# Patient Record
Sex: Female | Born: 1990 | Race: White | Hispanic: No | Marital: Single | Smoking: Never smoker
Health system: Southern US, Community
[De-identification: ages and names within clinical notes are randomized; demographics above are authoritative.]

## PROBLEM LIST (undated history)

## (undated) DIAGNOSIS — T7840XA Allergy, unspecified, initial encounter: Secondary | ICD-10-CM

## (undated) DIAGNOSIS — F32A Depression, unspecified: Secondary | ICD-10-CM

## (undated) DIAGNOSIS — F419 Anxiety disorder, unspecified: Secondary | ICD-10-CM

## (undated) DIAGNOSIS — E079 Disorder of thyroid, unspecified: Secondary | ICD-10-CM

## (undated) DIAGNOSIS — G43909 Migraine, unspecified, not intractable, without status migrainosus: Secondary | ICD-10-CM

## (undated) DIAGNOSIS — J45909 Unspecified asthma, uncomplicated: Secondary | ICD-10-CM

## (undated) HISTORY — DX: Anxiety disorder, unspecified: F41.9

## (undated) HISTORY — DX: Depression, unspecified: F32.A

## (undated) HISTORY — DX: Disorder of thyroid, unspecified: E07.9

## (undated) HISTORY — DX: Migraine, unspecified, not intractable, without status migrainosus: G43.909

## (undated) HISTORY — PX: TALUS RELEASE: SHX2479

## (undated) HISTORY — DX: Unspecified asthma, uncomplicated: J45.909

## (undated) HISTORY — DX: Allergy, unspecified, initial encounter: T78.40XA

---

## 2010-11-10 DIAGNOSIS — Z789 Other specified health status: Secondary | ICD-10-CM | POA: Insufficient documentation

## 2021-03-25 DIAGNOSIS — M25571 Pain in right ankle and joints of right foot: Secondary | ICD-10-CM | POA: Diagnosis not present

## 2021-03-31 DIAGNOSIS — S92101A Unspecified fracture of right talus, initial encounter for closed fracture: Secondary | ICD-10-CM | POA: Diagnosis not present

## 2021-04-03 ENCOUNTER — Other Ambulatory Visit: Payer: Self-pay | Admitting: Orthopaedic Surgery

## 2021-04-03 DIAGNOSIS — S92154G Nondisplaced avulsion fracture (chip fracture) of right talus, subsequent encounter for fracture with delayed healing: Secondary | ICD-10-CM

## 2021-04-11 ENCOUNTER — Ambulatory Visit
Admission: RE | Admit: 2021-04-11 | Discharge: 2021-04-11 | Disposition: A | Discharge status: Home / Self Care | Payer: Self-pay | Source: Ambulatory Visit | Attending: Orthopaedic Surgery | Admitting: Orthopaedic Surgery

## 2021-04-11 DIAGNOSIS — S92101A Unspecified fracture of right talus, initial encounter for closed fracture: Secondary | ICD-10-CM | POA: Diagnosis not present

## 2021-04-11 DIAGNOSIS — S92154G Nondisplaced avulsion fracture (chip fracture) of right talus, subsequent encounter for fracture with delayed healing: Secondary | ICD-10-CM

## 2021-04-24 DIAGNOSIS — G8918 Other acute postprocedural pain: Secondary | ICD-10-CM | POA: Diagnosis not present

## 2021-04-24 DIAGNOSIS — S92121A Displaced fracture of body of right talus, initial encounter for closed fracture: Secondary | ICD-10-CM | POA: Diagnosis not present

## 2021-05-09 DIAGNOSIS — S92121D Displaced fracture of body of right talus, subsequent encounter for fracture with routine healing: Secondary | ICD-10-CM | POA: Diagnosis not present

## 2021-05-09 DIAGNOSIS — Z9889 Other specified postprocedural states: Secondary | ICD-10-CM | POA: Diagnosis not present

## 2021-06-06 DIAGNOSIS — S92144D Nondisplaced dome fracture of right talus, subsequent encounter for fracture with routine healing: Secondary | ICD-10-CM | POA: Diagnosis not present

## 2021-06-06 DIAGNOSIS — Z9889 Other specified postprocedural states: Secondary | ICD-10-CM | POA: Diagnosis not present

## 2021-06-09 DIAGNOSIS — S92144D Nondisplaced dome fracture of right talus, subsequent encounter for fracture with routine healing: Secondary | ICD-10-CM | POA: Diagnosis not present

## 2021-06-11 DIAGNOSIS — S92144D Nondisplaced dome fracture of right talus, subsequent encounter for fracture with routine healing: Secondary | ICD-10-CM | POA: Diagnosis not present

## 2021-06-16 DIAGNOSIS — S92144D Nondisplaced dome fracture of right talus, subsequent encounter for fracture with routine healing: Secondary | ICD-10-CM | POA: Diagnosis not present

## 2021-06-18 DIAGNOSIS — S92144D Nondisplaced dome fracture of right talus, subsequent encounter for fracture with routine healing: Secondary | ICD-10-CM | POA: Diagnosis not present

## 2021-06-23 DIAGNOSIS — S92144D Nondisplaced dome fracture of right talus, subsequent encounter for fracture with routine healing: Secondary | ICD-10-CM | POA: Diagnosis not present

## 2021-06-27 DIAGNOSIS — S92144D Nondisplaced dome fracture of right talus, subsequent encounter for fracture with routine healing: Secondary | ICD-10-CM | POA: Diagnosis not present

## 2021-07-01 DIAGNOSIS — S92144D Nondisplaced dome fracture of right talus, subsequent encounter for fracture with routine healing: Secondary | ICD-10-CM | POA: Diagnosis not present

## 2021-07-08 DIAGNOSIS — S92144D Nondisplaced dome fracture of right talus, subsequent encounter for fracture with routine healing: Secondary | ICD-10-CM | POA: Diagnosis not present

## 2021-07-10 DIAGNOSIS — S92144D Nondisplaced dome fracture of right talus, subsequent encounter for fracture with routine healing: Secondary | ICD-10-CM | POA: Diagnosis not present

## 2021-07-15 DIAGNOSIS — S92144D Nondisplaced dome fracture of right talus, subsequent encounter for fracture with routine healing: Secondary | ICD-10-CM | POA: Diagnosis not present

## 2021-07-17 DIAGNOSIS — S92144D Nondisplaced dome fracture of right talus, subsequent encounter for fracture with routine healing: Secondary | ICD-10-CM | POA: Diagnosis not present

## 2021-07-18 DIAGNOSIS — S92144D Nondisplaced dome fracture of right talus, subsequent encounter for fracture with routine healing: Secondary | ICD-10-CM | POA: Diagnosis not present

## 2021-07-24 DIAGNOSIS — S92144D Nondisplaced dome fracture of right talus, subsequent encounter for fracture with routine healing: Secondary | ICD-10-CM | POA: Diagnosis not present

## 2021-07-28 DIAGNOSIS — S92144D Nondisplaced dome fracture of right talus, subsequent encounter for fracture with routine healing: Secondary | ICD-10-CM | POA: Diagnosis not present

## 2021-07-30 DIAGNOSIS — S92144D Nondisplaced dome fracture of right talus, subsequent encounter for fracture with routine healing: Secondary | ICD-10-CM | POA: Diagnosis not present

## 2021-08-14 DIAGNOSIS — S92144D Nondisplaced dome fracture of right talus, subsequent encounter for fracture with routine healing: Secondary | ICD-10-CM | POA: Diagnosis not present

## 2021-08-18 DIAGNOSIS — S92144D Nondisplaced dome fracture of right talus, subsequent encounter for fracture with routine healing: Secondary | ICD-10-CM | POA: Diagnosis not present

## 2021-08-20 DIAGNOSIS — S92144D Nondisplaced dome fracture of right talus, subsequent encounter for fracture with routine healing: Secondary | ICD-10-CM | POA: Diagnosis not present

## 2021-08-29 DIAGNOSIS — S92144D Nondisplaced dome fracture of right talus, subsequent encounter for fracture with routine healing: Secondary | ICD-10-CM | POA: Diagnosis not present

## 2021-08-29 DIAGNOSIS — M25571 Pain in right ankle and joints of right foot: Secondary | ICD-10-CM | POA: Diagnosis not present

## 2021-09-04 DIAGNOSIS — S92144D Nondisplaced dome fracture of right talus, subsequent encounter for fracture with routine healing: Secondary | ICD-10-CM | POA: Diagnosis not present

## 2021-09-08 DIAGNOSIS — S92144D Nondisplaced dome fracture of right talus, subsequent encounter for fracture with routine healing: Secondary | ICD-10-CM | POA: Diagnosis not present

## 2021-09-10 DIAGNOSIS — S92144D Nondisplaced dome fracture of right talus, subsequent encounter for fracture with routine healing: Secondary | ICD-10-CM | POA: Diagnosis not present

## 2021-09-17 DIAGNOSIS — S92144D Nondisplaced dome fracture of right talus, subsequent encounter for fracture with routine healing: Secondary | ICD-10-CM | POA: Diagnosis not present

## 2021-09-19 DIAGNOSIS — S92144D Nondisplaced dome fracture of right talus, subsequent encounter for fracture with routine healing: Secondary | ICD-10-CM | POA: Diagnosis not present

## 2021-09-24 DIAGNOSIS — S92144D Nondisplaced dome fracture of right talus, subsequent encounter for fracture with routine healing: Secondary | ICD-10-CM | POA: Diagnosis not present

## 2021-10-07 DIAGNOSIS — S92144D Nondisplaced dome fracture of right talus, subsequent encounter for fracture with routine healing: Secondary | ICD-10-CM | POA: Diagnosis not present

## 2021-10-10 DIAGNOSIS — S92144D Nondisplaced dome fracture of right talus, subsequent encounter for fracture with routine healing: Secondary | ICD-10-CM | POA: Diagnosis not present

## 2021-10-23 NOTE — Progress Notes (Signed)
?Phone [phone] ?  ?Subjective:  ? ?Patient is a 31 y.o. female presenting for annual physical.   ? ?Chief Complaint  ?Patient presents with  ? Annual Exam  ?  Fasting W/ Labs  ? Gynecologic Exam  ?  Last Pap smear was 2019.   ? ? ?See problem oriented charting- ?ROS- full  review of systems was completed and negative ? ?The following were reviewed and entered/updated in epic: ?Past Medical History:  ?Diagnosis Date  ? Allergy   ? Anxiety   ? Asthma   ? Depression   ? Migraines   ? Thyroid disease   ? ?Patient Active Problem List  ? Diagnosis Date Noted  ? Migraine, unspecified, not intractable, without status migrainosus 10/24/2021  ? Psoriasis 10/24/2021  ? Hypothyroidism 10/24/2021  ? History of foreign travel 11/10/2010  ? ?Past Surgical History:  ?Procedure Laterality Date  ? TALUS RELEASE    ? ? ?History reviewed. No pertinent family history. ? ?Medications- reviewed and updated ?Current Outpatient Medications  ?Medication Sig Dispense Refill  ? Acetaminophen (TYLENOL PO) Take 800 mg by mouth as needed.    ? ibuprofen (ADVIL) 800 MG tablet Take 800 mg by mouth as needed for moderate pain.    ? Multiple Vitamins-Minerals (MULTIVITAMIN WITH MINERALS) tablet Take 1 tablet by mouth daily.    ? ondansetron (ZOFRAN-ODT) 4 MG disintegrating tablet Take 4 mg by mouth as needed.    ? ?No current facility-administered medications for this visit.  ? ?Allergies-reviewed and updated ?Allergies  ?Allergen Reactions  ? Penicillins Anaphylaxis and Hives  ? ? ?Social History  ? ?Social History Narrative  ? Not on file  ? ?Objective  ?Objective:  ?BP 107/76 (BP Location: Left Arm, Patient Position: Sitting, Cuff Size: Large)   Pulse 80   Temp 98 ?F (36.7 ?C) (Temporal)   Ht _0  (1.676 m)   Wt 198 lb 8 oz (90 kg)   LMP 10/11/2021 (Approximate)   SpO2 98%   BMI 32.04 kg/m?  ?Physical Exam ?Vitals and nursing note reviewed.  ?Constitutional:   ?   Appearance: Normal appearance.  ?HENT:  ?   Head: Normocephalic.  ?    Right Ear: Tympanic membrane normal.  ?   Left Ear: Tympanic membrane normal.  ?   Nose: Nose normal.  ?   Mouth/Throat:  ?   Mouth: Mucous membranes are moist.  ?Eyes:  ?   Pupils: Pupils are equal, round, and reactive to light.  ?Cardiovascular:  ?   Rate and Rhythm: Normal rate and regular rhythm.  ?Pulmonary:  ?   Effort: Pulmonary effort is normal.  ?   Breath sounds: Normal breath sounds.  ?Musculoskeletal:     ?   General: Normal range of motion.  ?   Cervical back: Normal range of motion.  ?Lymphadenopathy:  ?   Cervical: No cervical adenopathy.  ?Skin: ?   General: Skin is warm and dry.  ?Neurological:  ?   Mental Status: She is alert.  ?Psychiatric:     ?   Mood and Affect: Mood normal.     ?   Behavior: Behavior normal.  ? ? ?  ?Assessment and Plan  ? ?Health Maintenance counseling: ?1. Anticipatory guidance: Patient counseled regarding regular dental exams q6 months, eye exams,  avoiding smoking and second hand smoke, limiting alcohol to 1 beverage per day, no illicit drugs.   ?2. Risk factor reduction:  Advised patient of need for regular exercise and diet rich with  fruits and vegetables to reduce risk of heart attack and stroke. ?Wt Readings from Last 3 Encounters:  ?10/24/21 198 lb 8 oz (90 kg)  ? ?3. Immunizations/screenings/ancillary studies ?Immunization History  ?Administered Date(s) Administered  ? DTaP 08/14/1991, 10/19/1991, 12/21/1991, 12/18/1992, 06/22/1996  ? HPV Quadrivalent 08/04/2006, 08/08/2007, 10/11/2007  ? Hepatitis A 11/10/2010  ? Hepatitis B, ped/adol 12/11/1998, 12/03/1999, 03/24/2000  ? HiB (PRP-OMP) 08/24/1991, 10/19/1991, 12/21/1991, 09/13/1992  ? IPV 08/24/1991, 10/19/1991, 12/21/1991, 06/22/1996  ? Influenza, Seasonal, Injecte, Preservative Fre 05/11/2015  ? Influenza-Unspecified 05/28/2021  ? MMR 09/20/1992, 06/22/1996  ? Meningococcal Polysaccharide 08/04/2006  ? PFIZER(Purple Top)SARS-COV-2 Vaccination 10/30/2019  ? PPD Test 08/18/2011, 03/01/2013, 02/23/2019  ? Tdap  08/04/2006, 12/20/2017  ? Typhoid Live 11/10/2010  ? ?Health Maintenance Due  ?Topic Date Due  ? HIV Screening  Never done  ? Hepatitis C Screening  Never done  ?  ?4. Cervical cancer screening: today ?5. Skin cancer screening- advised regular sunscreen use. Denies worrisome, changing, or new skin lesions.  ?6. Birth control/STD check: N/A ?7. Smoking associated screening: non- smoker ?8. Alcohol screening: 2--3/week ? ?Problem List Items Addressed This Visit   ?None ?Visit Diagnoses   ? ? Annual physical exam    -  Primary  ? Relevant Orders  ? Comprehensive metabolic panel  ? TSH  ? Lipid panel  ? CBC with Differential/Platelet  ? Cytology - PAP( [place])  ? History of hypothyroidism      ? Relevant Orders  ? T4, free  ? ?  ? ? ?Recommended follow up: Return for any future concerns. ?No future appointments. ? ? ?Lab/Order associations: fasting ? ?Jeanie Sewer, NP ? ? ?

## 2021-10-24 ENCOUNTER — Ambulatory Visit: Payer: 59 | Admitting: Family

## 2021-10-24 ENCOUNTER — Other Ambulatory Visit (HOSPITAL_COMMUNITY)
Admission: RE | Admit: 2021-10-24 | Discharge: 2021-10-24 | Disposition: A | Discharge status: Home / Self Care | Payer: 59 | Source: Ambulatory Visit | Attending: Family | Admitting: Family

## 2021-10-24 ENCOUNTER — Encounter: Payer: Self-pay | Admitting: Family

## 2021-10-24 VITALS — BP 107/76 | HR 80 | Temp 98.0°F | Ht 66.0 in | Wt 198.5 lb

## 2021-10-24 DIAGNOSIS — G43909 Migraine, unspecified, not intractable, without status migrainosus: Secondary | ICD-10-CM | POA: Insufficient documentation

## 2021-10-24 DIAGNOSIS — Z8639 Personal history of other endocrine, nutritional and metabolic disease: Secondary | ICD-10-CM | POA: Diagnosis not present

## 2021-10-24 DIAGNOSIS — E039 Hypothyroidism, unspecified: Secondary | ICD-10-CM | POA: Insufficient documentation

## 2021-10-24 DIAGNOSIS — Z0000 Encounter for general adult medical examination without abnormal findings: Secondary | ICD-10-CM | POA: Insufficient documentation

## 2021-10-24 DIAGNOSIS — L409 Psoriasis, unspecified: Secondary | ICD-10-CM | POA: Insufficient documentation

## 2021-10-24 LAB — LIPID PANEL
Cholesterol: 153 mg/dL (ref 0–200)
HDL: 39.4 mg/dL (ref >39.00)
LDL Cholesterol: 84 mg/dL (ref 0–99)
NonHDL: 113.47
Total CHOL/HDL Ratio: 4
Triglycerides: 146 mg/dL (ref 0.0–149.0)
VLDL: 29.2 mg/dL (ref 0.0–40.0)

## 2021-10-24 LAB — CBC WITH DIFFERENTIAL/PLATELET
Basophils Absolute: 0 10*3/uL (ref 0.0–0.1)
Basophils Relative: 0.5 % (ref 0.0–3.0)
Eosinophils Absolute: 0.2 10*3/uL (ref 0.0–0.7)
Eosinophils Relative: 2.3 % (ref 0.0–5.0)
HCT: 40.8 % (ref 36.0–46.0)
Hemoglobin: 13.7 g/dL (ref 12.0–15.0)
Lymphocytes Relative: 32.3 % (ref 12.0–46.0)
Lymphs Abs: 2.2 10*3/uL (ref 0.7–4.0)
MCHC: 33.7 g/dL (ref 30.0–36.0)
MCV: 89.7 fl (ref 78.0–100.0)
Monocytes Absolute: 0.6 10*3/uL (ref 0.1–1.0)
Monocytes Relative: 9.6 % (ref 3.0–12.0)
Neutro Abs: 3.7 10*3/uL (ref 1.4–7.7)
Neutrophils Relative %: 55.3 % (ref 43.0–77.0)
Platelets: 206 10*3/uL (ref 150.0–400.0)
RBC: 4.54 Mil/uL (ref 3.87–5.11)
RDW: 12.8 % (ref 11.5–15.5)
WBC: 6.6 10*3/uL (ref 4.0–10.5)

## 2021-10-24 LAB — COMPREHENSIVE METABOLIC PANEL
ALT: 21 U/L (ref 0–35)
AST: 19 U/L (ref 0–37)
Albumin: 4.6 g/dL (ref 3.5–5.2)
Alkaline Phosphatase: 53 U/L (ref 39–117)
BUN: 15 mg/dL (ref 6–23)
CO2: 26 mEq/L (ref 19–32)
Calcium: 9.5 mg/dL (ref 8.4–10.5)
Chloride: 103 mEq/L (ref 96–112)
Creatinine, Ser: 0.85 mg/dL (ref 0.40–1.20)
GFR: 91.98 mL/min (ref >60.00)
Glucose, Bld: 91 mg/dL (ref 70–99)
Potassium: 4.4 mEq/L (ref 3.5–5.1)
Sodium: 138 mEq/L (ref 135–145)
Total Bilirubin: 0.5 mg/dL (ref 0.2–1.2)
Total Protein: 7.2 g/dL (ref 6.0–8.3)

## 2021-10-24 LAB — T4, FREE: Free T4: 0.77 ng/dL (ref 0.60–1.60)

## 2021-10-24 LAB — TSH: TSH: 1.99 u[IU]/mL (ref 0.35–5.50)

## 2021-10-24 NOTE — Patient Instructions (Signed)
Welcome to [facility] at [facility]! It was a pleasure meeting you today.    PLEASE NOTE:  If you had any LAB tests please let us know if you have not heard back within a few days. You may see your results on MyChart before we have a chance to review them but we will give you a call once they are reviewed by us. If we ordered any REFERRALS today, please let us know if you have not heard from their office within the next week.  Let us know through MyChart if you are needing REFILLS, or have your pharmacy send us the request. You can also use MyChart to communicate with me or any office staff.  Please try these tips to maintain a healthy lifestyle:  Eat most of your calories during the day when you are active. Eliminate processed foods including packaged sweets (pies, cakes, cookies), reduce intake of potatoes, white bread, white pasta, and white rice. Look for whole grain options, oat flour or almond flour.  Each meal should contain half fruits/vegetables, one quarter protein, and one quarter carbs (no bigger than a computer mouse).  Cut down on sweet beverages. This includes juice, soda, and sweet tea. Also watch fruit intake, though this is a healthier sweet option, it still contains natural sugar! Limit to 3 servings daily.  Drink at least 1 glass of water with each meal and aim for at least 8 glasses per day  Exercise at least 150 minutes every week.   

## 2021-11-03 LAB — CYTOLOGY - PAP
Adequacy: ABNORMAL
Chlamydia: NEGATIVE
Comment: NEGATIVE
Comment: NORMAL
Neisseria Gonorrhea: NEGATIVE

## 2021-11-05 NOTE — Progress Notes (Signed)
Debra Webb, ? ?All labs are within normal range, glucose (sugar), electrolytes, kidney and liver function, blood count, thyroid, and cholesterol numbers are all good.   ?Your pap specimen was negative for STDs, but unfortunately, not sufficient for cervical cell testing, so have to repeat testing. You can schedule this when convenient. ? ?Keep up the good work with controlling your diet and continue to try and shoot for 30 minutes of exercise daily!  ?

## 2022-05-04 ENCOUNTER — Encounter: Payer: Self-pay | Admitting: *Deleted

## 2022-06-05 ENCOUNTER — Other Ambulatory Visit (HOSPITAL_COMMUNITY): Payer: Self-pay

## 2022-06-05 MED ORDER — CIPROFLOXACIN HCL 500 MG PO TABS
500.0000 mg | ORAL_TABLET | Freq: Once | ORAL | 0 refills | Status: AC
  Filled 2022-06-05: qty 1, 1d supply, fill #0

## 2022-07-23 ENCOUNTER — Encounter: Payer: Self-pay | Admitting: *Deleted

## 2022-10-29 IMAGING — CT CT FOOT*R* W/O CM
4 series · 13 of 27 positions shown, 15 images · non-contrast
Comparison: None.

CLINICAL DATA: Right talar fracture.  Pain and swelling.

EXAM:
CT OF THE RIGHT FOOT WITHOUT CONTRAST
TECHNIQUE: Multidetector CT imaging of the right foot was performed according
to the standard protocol. Multiplanar CT image reconstructions were
also generated.

[Series 4: soft tissue lower extremity · axial · 0.55mm/px · z∈[-193,-89]mm · 4 of 88 slices shown]
[im 18/88  soft-tissue]
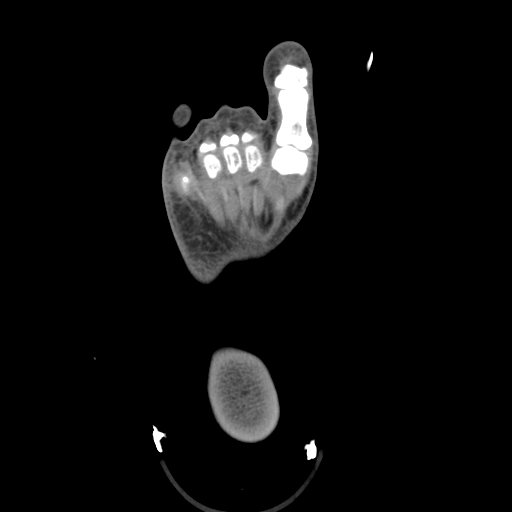
[im 35/88  soft-tissue]
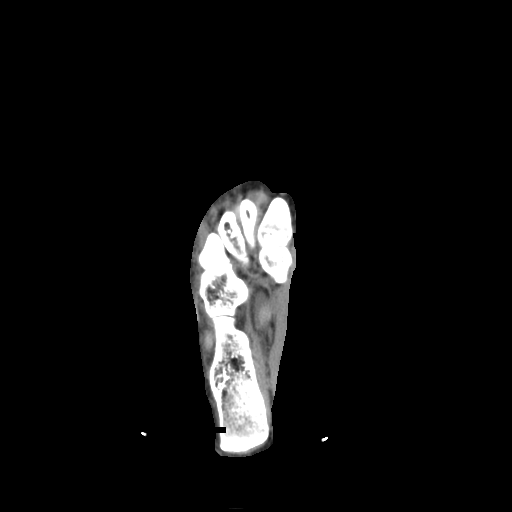
[im 53/88  soft-tissue]
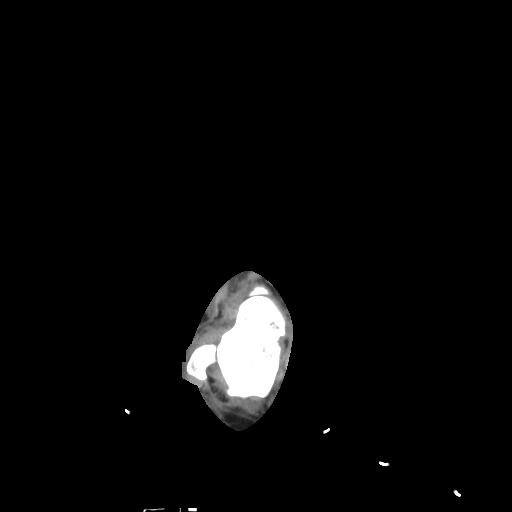
[im 70/88  soft-tissue]
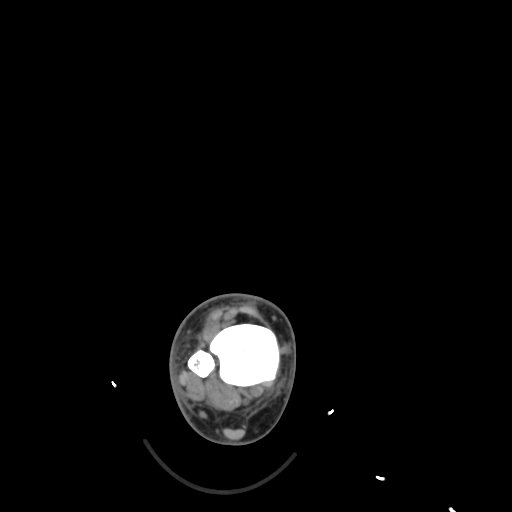

[Series 10: sagsoft tissue · sagittal · 0.34mm/px · 5 of 77 slices shown, 6 images]
[im 26/77  bone]
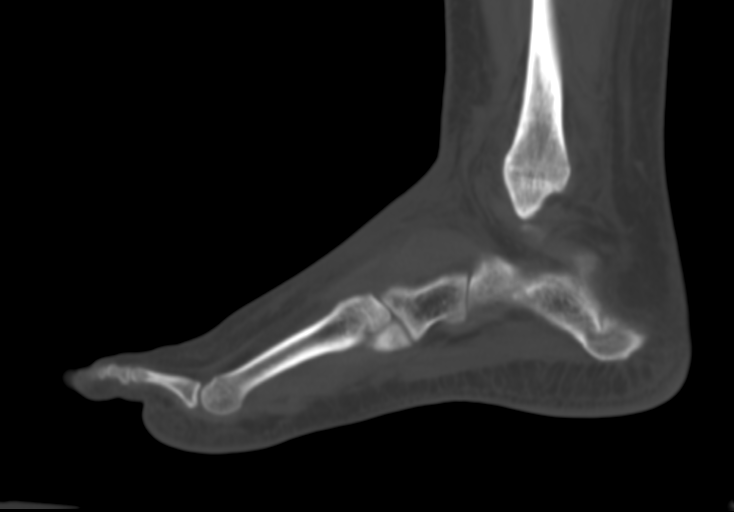
[im 32/77  bone]
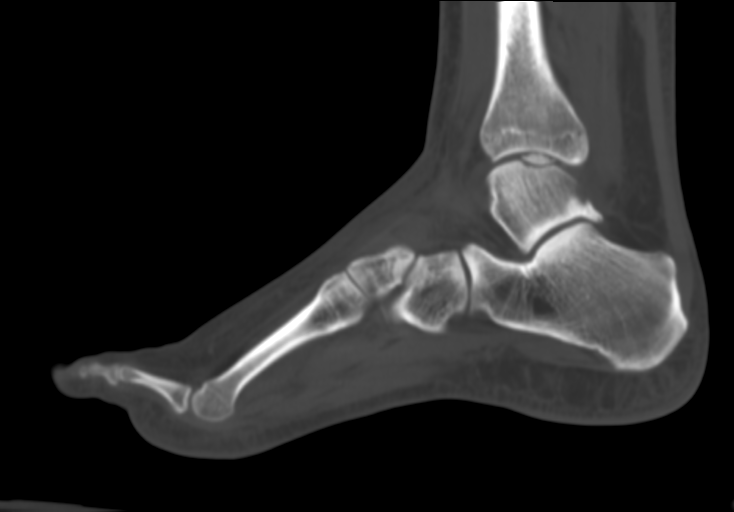
[im 39/77  soft-tissue]
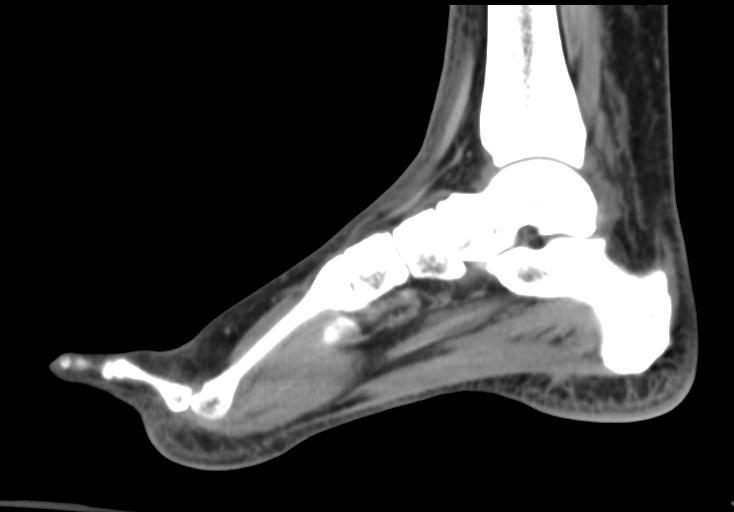
[im 39/77  bone]
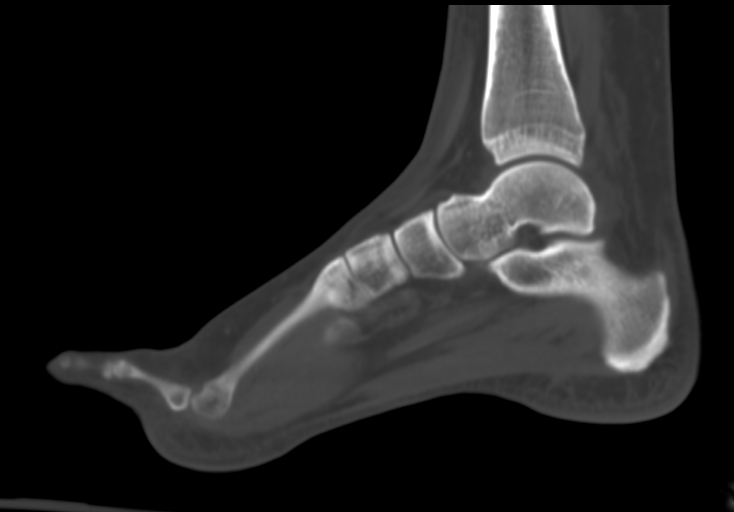
[im 45/77  bone]
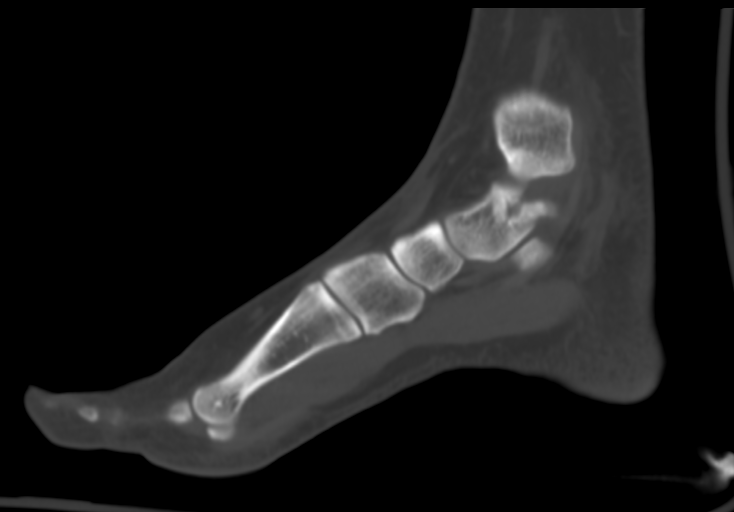
[im 51/77  bone]
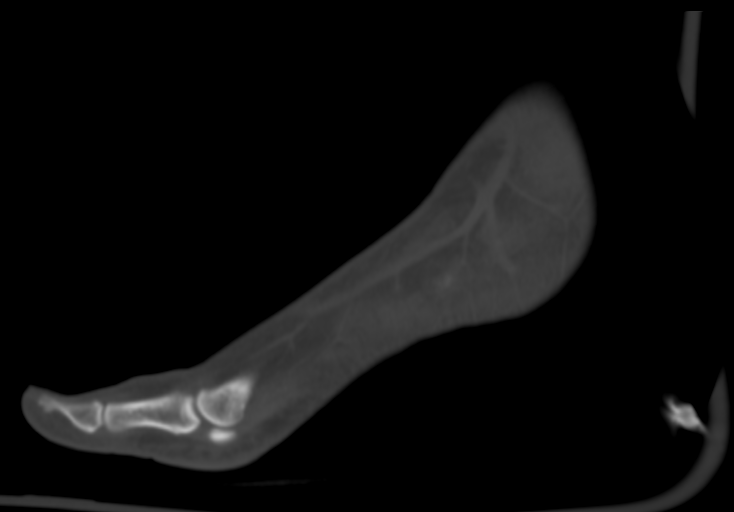

[Series 602: cor foot · axial · 0.55mm/px · z∈[-232,-192]mm · 2 of 64 slices shown, 3 images]
[im 22/64  soft-tissue]
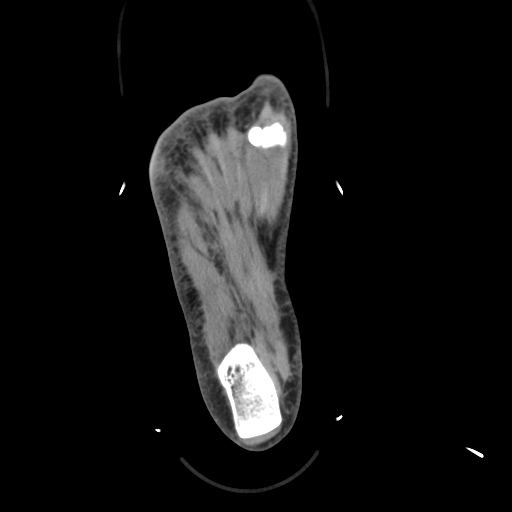
[im 22/64  bone]
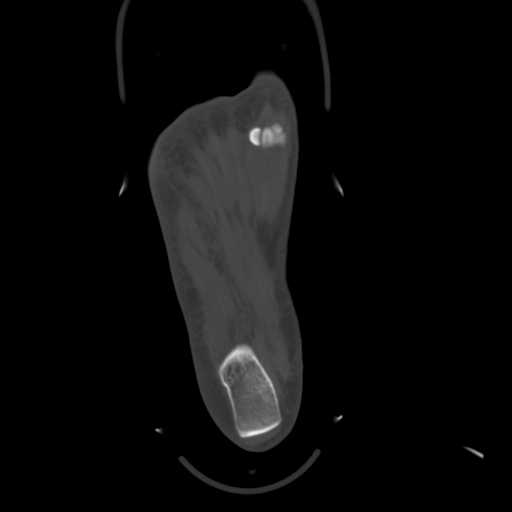
[im 43/64  bone]
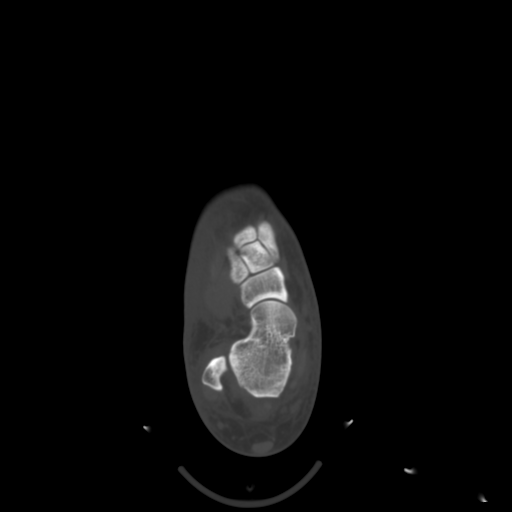

[Series 603: bone cor foot · axial · 0.55mm/px · z∈[-246,-212]mm · 2 of 56 slices shown]
[im 19/56  bone]
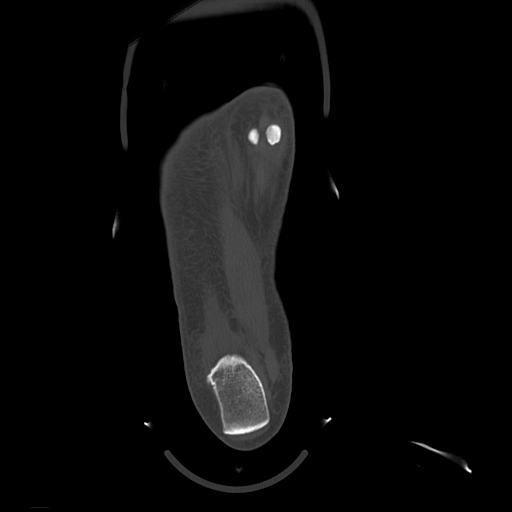
[im 37/56  bone]
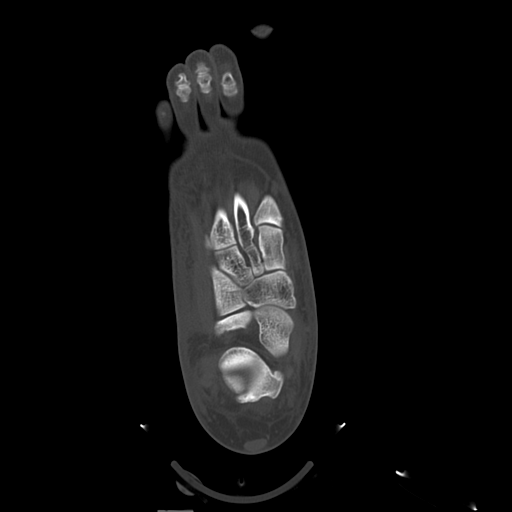

[13 of 27 positions shown; findings below may reference images not displayed]

FINDINGS: Bones/Joint/Cartilage

8 x 13 mm osteochondral lesion involving the mid lateral corner of
the talus with the osteochondral fragment flipped 180 degrees with
the articular surface abutting the donor site.

No other fracture or dislocation. Normal alignment. Moderate ankle
joint effusion.

Ligaments

Ligaments are suboptimally evaluated by CT.

Muscles and Tendons
Muscles are normal. Flexor, extensor, peroneal and Achilles tendons
are grossly intact.

Soft tissue
No fluid collection or hematoma. No soft tissue mass. Soft tissue
edema around the ankle and dorsal aspect of the foot.
IMPRESSION: 1. Unstable 8 x 13 mm osteochondral lesion involving the mid lateral
corner of the talus with the osteochondral fragment flipped 180
degrees with the articular surface abutting the donor site.

## 2023-02-17 ENCOUNTER — Ambulatory Visit (INDEPENDENT_AMBULATORY_CARE_PROVIDER_SITE_OTHER): Payer: 59 | Admitting: Family

## 2023-02-17 ENCOUNTER — Other Ambulatory Visit (HOSPITAL_COMMUNITY)
Admission: RE | Admit: 2023-02-17 | Discharge: 2023-02-17 | Disposition: A | Discharge status: Home / Self Care | Payer: 59 | Source: Ambulatory Visit | Attending: Family | Admitting: Family

## 2023-02-17 ENCOUNTER — Encounter: Payer: Self-pay | Admitting: Family

## 2023-02-17 VITALS — BP 110/75 | Temp 97.3°F | Ht 66.0 in | Wt 193.6 lb

## 2023-02-17 DIAGNOSIS — Z1159 Encounter for screening for other viral diseases: Secondary | ICD-10-CM | POA: Diagnosis not present

## 2023-02-17 DIAGNOSIS — Z0000 Encounter for general adult medical examination without abnormal findings: Secondary | ICD-10-CM

## 2023-02-17 DIAGNOSIS — Z114 Encounter for screening for human immunodeficiency virus [HIV]: Secondary | ICD-10-CM

## 2023-02-17 LAB — COMPREHENSIVE METABOLIC PANEL
ALT: 23 U/L (ref 0–35)
AST: 20 U/L (ref 0–37)
Albumin: 4.9 g/dL (ref 3.5–5.2)
Alkaline Phosphatase: 54 U/L (ref 39–117)
BUN: 14 mg/dL (ref 6–23)
CO2: 26 mEq/L (ref 19–32)
Calcium: 10.3 mg/dL (ref 8.4–10.5)
Chloride: 103 mEq/L (ref 96–112)
Creatinine, Ser: 0.88 mg/dL (ref 0.40–1.20)
GFR: 87.41 mL/min (ref >60.00)
Glucose, Bld: 95 mg/dL (ref 70–99)
Potassium: 4.8 mEq/L (ref 3.5–5.1)
Sodium: 139 mEq/L (ref 135–145)
Total Bilirubin: 0.6 mg/dL (ref 0.2–1.2)
Total Protein: 7.7 g/dL (ref 6.0–8.3)

## 2023-02-17 LAB — CBC WITH DIFFERENTIAL/PLATELET
Basophils Absolute: 0 10*3/uL (ref 0.0–0.1)
Basophils Relative: 0.6 % (ref 0.0–3.0)
Eosinophils Absolute: 0.2 10*3/uL (ref 0.0–0.7)
Eosinophils Relative: 2.6 % (ref 0.0–5.0)
HCT: 43.9 % (ref 36.0–46.0)
Hemoglobin: 14.7 g/dL (ref 12.0–15.0)
Lymphocytes Relative: 29.7 % (ref 12.0–46.0)
Lymphs Abs: 2.3 10*3/uL (ref 0.7–4.0)
MCHC: 33.5 g/dL (ref 30.0–36.0)
MCV: 90.4 fl (ref 78.0–100.0)
Monocytes Absolute: 0.6 10*3/uL (ref 0.1–1.0)
Monocytes Relative: 7.5 % (ref 3.0–12.0)
Neutro Abs: 4.7 10*3/uL (ref 1.4–7.7)
Neutrophils Relative %: 59.6 % (ref 43.0–77.0)
Platelets: 229 10*3/uL (ref 150.0–400.0)
RBC: 4.86 Mil/uL (ref 3.87–5.11)
RDW: 13.1 % (ref 11.5–15.5)
WBC: 7.8 10*3/uL (ref 4.0–10.5)

## 2023-02-17 NOTE — Patient Instructions (Signed)
It was very nice to see you today!    I will review your lab results via MyChart in a few days.   The PAP results will take a little longer.   Have a great rest of the summer and upcoming wedding!      PLEASE NOTE:  If you had any lab tests please let us know if you have not heard back within a few days. You may see your results on MyChart before we have a chance to review them but we will give you a call once they are reviewed by [place]. If we ordered any referrals today, please let us know if you have not heard from their office within the next week.

## 2023-02-17 NOTE — Progress Notes (Addendum)
Phone [phone]   Subjective:   Patient is a 32 y.o. female presenting for annual physical.    Chief Complaint  Patient presents with   Gynecologic Exam   Annual Exam    Fasting w/labs    See problem oriented charting- ROS- full  review of systems was completed and negative  The following were reviewed and entered/updated in epic: Past Medical History:  Diagnosis Date   Allergy    Anxiety    Asthma    Depression    Migraines    Thyroid disease    Patient Active Problem List   Diagnosis Date Noted   Migraine, unspecified, not intractable, without status migrainosus 10/24/2021   Psoriasis 10/24/2021   Hypothyroidism 10/24/2021   Past Surgical History:  Procedure Laterality Date   TALUS RELEASE      No family history on file.  Medications- reviewed and updated Current Outpatient Medications  Medication Sig Dispense Refill   Acetaminophen (TYLENOL PO) Take 800 mg by mouth as needed.     ibuprofen (ADVIL) 800 MG tablet Take 800 mg by mouth as needed for moderate pain.     Multiple Vitamins-Minerals (MULTIVITAMIN WITH MINERALS) tablet Take 1 tablet by mouth daily.     ondansetron (ZOFRAN-ODT) 4 MG disintegrating tablet Take 4 mg by mouth as needed.     No current facility-administered medications for this visit.   Allergies-reviewed and updated Allergies  Allergen Reactions   Penicillins Anaphylaxis and Hives    Social History   Social History Narrative   Not on file   Objective  Objective:  BP 110/75   Temp (!) 97.3 F (36.3 C) (Temporal)   Ht 5\' 6"  (1.676 m)   Wt 193 lb 9.6 oz (87.8 kg)   LMP 02/03/2023 (Approximate)   BMI 31.25 kg/m  Physical Exam Vitals and nursing note reviewed. Exam conducted with a chaperone present.  Constitutional:      Appearance: Normal appearance.  HENT:     Head: Normocephalic.     Right Ear: Tympanic membrane normal.     Left Ear: Tympanic membrane normal.     Nose: Nose normal.     Mouth/Throat:     Mouth:  Mucous membranes are moist.  Eyes:     Pupils: Pupils are equal, round, and reactive to light.  Cardiovascular:     Rate and Rhythm: Normal rate and regular rhythm.  Pulmonary:     Effort: Pulmonary effort is normal.     Breath sounds: Normal breath sounds.  Genitourinary:    Exam position: Lithotomy position.     Pubic Area: No rash or pubic lice.      Labia:        Right: No rash.        Left: No rash.      Vagina: Vaginal discharge (leukorrhea) present. No tenderness.     Cervix: Friability (mild) present.     Comments: PAP smear obtained for testing. Musculoskeletal:        General: Normal range of motion.     Cervical back: Normal range of motion.  Lymphadenopathy:     Cervical: No cervical adenopathy.  Skin:    General: Skin is warm and dry.  Neurological:     Mental Status: She is alert.  Psychiatric:        Mood and Affect: Mood normal.        Behavior: Behavior normal.      Assessment and Plan   Health Maintenance counseling: 1. Anticipatory  guidance: Patient counseled regarding regular dental exams q6 months, eye exams,  avoiding smoking and second hand smoke, limiting alcohol to 1 beverage per day, no illicit drugs.   2. Risk factor reduction:  Advised patient of need for regular exercise and diet rich with fruits and vegetables to reduce risk of heart attack and stroke. Wt Readings from Last 3 Encounters:  02/17/23 193 lb 9.6 oz (87.8 kg)  10/24/21 198 lb 8 oz (90 kg)   3. Immunizations/screenings/ancillary studies Immunization History  Administered Date(s) Administered   DTaP 08/14/1991, 10/19/1991, 12/21/1991, 12/18/1992, 06/22/1996   HIB (PRP-OMP) 08/24/1991, 10/19/1991, 12/21/1991, 09/13/1992   HIB, Unspecified 08/24/1991, 10/19/1991, 12/21/1991, 09/13/1992   HPV Quadrivalent 08/04/2006, 08/08/2007, 10/11/2007   Hepatitis A 11/10/2010   Hepatitis B, PED/ADOLESCENT 12/11/1998, 12/03/1999, 03/24/2000   IPV 08/24/1991, 10/19/1991, 12/21/1991, 06/22/1996    Influenza, Seasonal, Injecte, Preservative Fre 05/11/2015   Influenza-Unspecified 05/28/2021   MMR 09/20/1992, 06/22/1996   Meningococcal polysaccharide vaccine (MPSV4) 08/04/2006   PFIZER(Purple Top)SARS-COV-2 Vaccination 10/30/2019   PPD Test 08/18/2011, 03/01/2013, 02/23/2019   Tdap 08/04/2006, 12/20/2017   Typhoid Live 11/10/2010   Health Maintenance Due  Topic Date Due   HIV Screening  Never done   Hepatitis C Screening  Never done    4. Cervical cancer screening: today 5. Skin cancer screening- advised regular sunscreen use. Denies worrisome, changing, or new skin lesions.  6. Birth control/STD check: N/A 7. Smoking associated screening: non- smoker 8. Alcohol screening: 2--3/week  Problem List Items Addressed This Visit   None Visit Diagnoses     Annual physical exam    -  Primary   Relevant Orders   Cytology - PAP   CBC with Differential/Platelet   Comprehensive metabolic panel   Screening for HIV (human immunodeficiency virus)       Relevant Orders   HIV antibody (with reflex)   Need for hepatitis C screening test       Relevant Orders   Hepatitis C Antibody      Recommended follow up: No follow-ups on file. No future appointments.   Lab/Order associations: fasting  Dulce Sellar, NP

## 2023-02-17 NOTE — Progress Notes (Deleted)
   Patient ID: Debra Webb, female    DOB: [birth year], 32 y.o.   MRN: [id number]  No chief complaint on file.   HPI:   Assessment & Plan:  There are no diagnoses linked to this encounter.  Subjective:    Outpatient Medications Prior to Visit  Medication Sig Dispense Refill   Acetaminophen (TYLENOL PO) Take 800 mg by mouth as needed.     ibuprofen (ADVIL) 800 MG tablet Take 800 mg by mouth as needed for moderate pain.     Multiple Vitamins-Minerals (MULTIVITAMIN WITH MINERALS) tablet Take 1 tablet by mouth daily.     ondansetron (ZOFRAN-ODT) 4 MG disintegrating tablet Take 4 mg by mouth as needed.     No facility-administered medications prior to visit.   Past Medical History:  Diagnosis Date   Allergy    Anxiety    Asthma    Depression    Migraines    Thyroid disease    Past Surgical History:  Procedure Laterality Date   TALUS RELEASE     Allergies  Allergen Reactions   Penicillins Anaphylaxis and Hives      Objective:    Physical Exam Vitals and nursing note reviewed.  Constitutional:      Appearance: Normal appearance.  Cardiovascular:     Rate and Rhythm: Normal rate and regular rhythm.  Pulmonary:     Effort: Pulmonary effort is normal.     Breath sounds: Normal breath sounds.  Musculoskeletal:        General: Normal range of motion.  Skin:    General: Skin is warm and dry.  Neurological:     Mental Status: She is alert.  Psychiatric:        Mood and Affect: Mood normal.        Behavior: Behavior normal.    There were no vitals taken for this visit. Wt Readings from Last 3 Encounters:  10/24/21 198 lb 8 oz (90 kg)       Dulce Sellar, NP

## 2023-02-18 LAB — HIV ANTIBODY (ROUTINE TESTING W REFLEX): HIV 1&2 Ab, 4th Generation: NONREACTIVE

## 2023-02-18 LAB — HEPATITIS C ANTIBODY: Hepatitis C Ab: NONREACTIVE

## 2023-02-22 LAB — CYTOLOGY - PAP
Comment: NEGATIVE
Diagnosis: NEGATIVE
High risk HPV: NEGATIVE

## 2023-02-23 ENCOUNTER — Other Ambulatory Visit: Payer: Self-pay | Admitting: Oncology

## 2023-02-23 DIAGNOSIS — Z006 Encounter for examination for normal comparison and control in clinical research program: Secondary | ICD-10-CM

## 2023-10-01 ENCOUNTER — Other Ambulatory Visit: Payer: Self-pay

## 2023-10-01 ENCOUNTER — Ambulatory Visit
Admission: RE | Admit: 2023-10-01 | Discharge: 2023-10-01 | Disposition: A | Discharge status: Home / Self Care | Payer: 59 | Source: Ambulatory Visit | Attending: Family Medicine | Admitting: Family Medicine

## 2023-10-01 VITALS — BP 138/87 | HR 83 | Temp 97.4°F | Resp 18 | Ht 66.0 in | Wt 196.8 lb

## 2023-10-01 DIAGNOSIS — L0201 Cutaneous abscess of face: Secondary | ICD-10-CM

## 2023-10-01 MED ORDER — DOXYCYCLINE HYCLATE 100 MG PO CAPS: 100.0000 mg | ORAL_CAPSULE | Freq: Two times a day (BID) | ORAL | 0 refills | Status: AC

## 2023-10-01 NOTE — ED Triage Notes (Signed)
 Pt presents with complaints of swelling to left cheek that she noticed this morning but has worsened throughout the day. Pt states the area has become red, is warm to the touch, and is painful. Pt currently rates her pain a 3/10.

## 2023-10-01 NOTE — ED Provider Notes (Signed)
 [facility]    CSN: [id number] Arrival date & time: 10/01/23  4098      History   Chief Complaint Chief Complaint  Patient presents with   Wound Check    Swelling to L cheek with redness, warmth and firm area. Formed over night. Painful to touch. - Entered by patient    HPI Debra Webb is a 33 y.o. female.    Wound Check  Left cheek swelling and tenderness woke with symptoms this morning which have worsened throughout the day.  Denies fever,, chills, sweats, drainage, dental pain, intraoral lesions, difficulty swallowing, difficulty breathing, ear pain.  Denies history of similar symptoms in the past.  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  Denies known contacts with any skin infections.  Denies household contacts with similar symptoms.  LMP today.  Past Medical History:  Diagnosis Date   Allergy    Anxiety    Asthma    Depression    Migraines    Thyroid disease     Patient Active Problem List   Diagnosis Date Noted   Migraine, unspecified, not intractable, without status migrainosus 10/24/2021   Psoriasis 10/24/2021   Hypothyroidism 10/24/2021    Past Surgical History:  Procedure Laterality Date   TALUS RELEASE      OB History   No obstetric history on file.      Home Medications    Prior to Admission medications   Medication Sig Start Date End Date Taking? Authorizing Provider  doxycycline (VIBRAMYCIN) 100 MG capsule Take 1 capsule (100 mg total) by mouth 2 (two) times daily for 7 days. 10/01/23 10/08/23 Yes Meliton Rattan, PA  Multiple Vitamins-Minerals (MULTIVITAMIN WITH MINERALS) tablet Take 1 tablet by mouth daily.    [provider]    Family History History reviewed. No pertinent family history.  Social History Social History   Tobacco Use   Smoking status: Never   Smokeless tobacco: Never  Vaping Use   Vaping status: Never Used  Substance Use Topics   Alcohol use: Yes    Alcohol/week: 3.0 standard drinks of  alcohol    Types: 3 Glasses of wine per week   Drug use: Never     Allergies   Penicillins   Review of Systems Review of Systems  Constitutional:  Negative for chills and fever.  HENT:  Negative for ear pain and trouble swallowing.   Gastrointestinal:  Negative for nausea and vomiting.     Physical Exam Triage Vital Signs ED Triage Vitals  Encounter Vitals Group     BP 10/01/23 1846 (!) 140/87     Systolic BP Percentile --      Diastolic BP Percentile --      Pulse Rate 10/01/23 1846 83     Resp 10/01/23 1846 18     Temp 10/01/23 1846 (!) 97.4 F (36.3 C)     Temp Source 10/01/23 1846 Oral     SpO2 10/01/23 1846 97 %     Weight 10/01/23 1845 196 lb 12.8 oz (89.3 kg)     Height 10/01/23 1845 5\' 6"  (1.676 m)     Head Circumference --      Peak Flow --      Pain Score 10/01/23 1845 3     Pain Loc --      Pain Education --      Exclude from Growth Chart --    No data found.  Updated Vital Signs BP 138/87 (BP Location:  Left Arm)   Pulse 83   Temp (!) 97.4 F (36.3 C) (Oral)   Resp 18   Ht 5\' 6"  (1.676 m)   Wt 196 lb 12.8 oz (89.3 kg)   LMP 10/01/2023 (Exact Date)   SpO2 97%   BMI 31.76 kg/m   Visual Acuity Right Eye Distance:   Left Eye Distance:   Bilateral Distance:    Right Eye Near:   Left Eye Near:    Bilateral Near:     Physical Exam Vitals and nursing note reviewed.  Constitutional:      Appearance: She is not ill-appearing.  HENT:     Head: Normocephalic.     Comments: Local swelling and induration left cheek buccal mucosa, has a papular lesion in the central, nonfluctuant, no active drainage    Right Ear: Tympanic membrane and ear canal normal.     Left Ear: Tympanic membrane and ear canal normal.  Cardiovascular:     Rate and Rhythm: Normal rate.  Pulmonary:     Effort: Pulmonary effort is normal. No respiratory distress.  Musculoskeletal:     Cervical back: Neck supple.  Lymphadenopathy:     Cervical: No cervical adenopathy.   Neurological:     Mental Status: She is alert.  Psychiatric:        Mood and Affect: Mood normal.      UC Treatments / Results  Labs (all labs ordered are listed, but only abnormal results are displayed) Labs Reviewed - No data to display  EKG   Radiology No results found.  Procedures Procedures (including critical care time)  Medications Ordered in UC Medications - No data to display  Initial Impression / Assessment and Plan / UC Course  I have reviewed the triage vital signs and the nursing notes.  Pertinent labs & imaging results that were available during my care of the patient were reviewed by me and considered in my medical decision making (see chart for details).     Discussed with patient early skin abscess not fluctuant not ready to be drained, no overlying cellulitis, will start on doxycycline, recommend frequent warm compresses, recheck in 48 hours, ED for severe symptoms or concerns such as fever, increased swelling, spread of symptoms to neck, difficulty swallowing or difficulty breathing Final Clinical Impressions(s) / UC Diagnoses   Final diagnoses:  Cutaneous abscess of face   Discharge Instructions   None    ED Prescriptions     Medication Sig Dispense Auth. Provider   doxycycline (VIBRAMYCIN) 100 MG capsule Take 1 capsule (100 mg total) by mouth 2 (two) times daily for 7 days. 14 capsule Meliton Rattan, [place]      PDMP not reviewed this encounter.   Meliton Rattan, [place] 10/01/23 [phone]

## 2023-10-03 ENCOUNTER — Emergency Department (HOSPITAL_COMMUNITY)
Admission: EM | Admit: 2023-10-03 | Discharge: 2023-10-03 | Disposition: A | Discharge status: Home / Self Care | Payer: 59 | Attending: Emergency Medicine | Admitting: Emergency Medicine

## 2023-10-03 ENCOUNTER — Encounter (HOSPITAL_COMMUNITY): Payer: Self-pay

## 2023-10-03 DIAGNOSIS — L03211 Cellulitis of face: Secondary | ICD-10-CM | POA: Diagnosis not present

## 2023-10-03 DIAGNOSIS — J45909 Unspecified asthma, uncomplicated: Secondary | ICD-10-CM | POA: Diagnosis not present

## 2023-10-03 MED ORDER — CLINDAMYCIN HCL 150 MG PO CAPS: 300.0000 mg | ORAL_CAPSULE | Freq: Four times a day (QID) | ORAL | 0 refills | Status: AC

## 2023-10-03 MED ORDER — CLINDAMYCIN HCL 150 MG PO CAPS
300.0000 mg | ORAL_CAPSULE | Freq: Once | ORAL | Status: AC
  Administered 2023-10-03: 300 mg via ORAL
  Filled 2023-10-03: qty 2

## 2023-10-03 NOTE — ED Notes (Signed)
 Pt provided discharge paperwork by Provider.

## 2023-10-03 NOTE — ED Triage Notes (Signed)
 Pt c/o increasing abscess on L cheek x2 days.  Pt was seen at UC x2 days ago and started on Doxy.  Pt reports swelling has increased.

## 2023-10-03 NOTE — Discharge Instructions (Addendum)
 It was a pleasure caring for you today. Please cease doxy and start taking clindamycin. Seek emergency care if the area continues to worsen over the next 24 hours.   Alternating between 650 mg Tylenol and 400 mg Advil: The best way to alternate taking Acetaminophen (example Tylenol) and Ibuprofen (example Advil/Motrin) is to take them 3 hours apart. For example, if you take ibuprofen at 6 am you can then take Tylenol at 9 am. You can continue this regimen throughout the day, making sure you do not exceed the recommended maximum dose for each drug.

## 2023-10-03 NOTE — ED Provider Notes (Signed)
 [place] [facility] AT [facility] Provider Note   CSN: [id number] Arrival date & time: 10/03/23  1230     History  Chief Complaint  Patient presents with   Abscess    Debra Webb is a 33 y.o. female with PMHx anxiety, asthma, depression, migraines who presents to ED concerned for facial swelling. Patient noticed this area of swelling and initially thought that it was a pimple, but the area of swelling continued to increase so she went to UC. UC provider prescribed patient doxy. Patient has taken 3 doses of doxy, but swelling has continued to increase. No other infectious symptoms today.  Patient stating that she is not pregnant because she is currently on her menstrual cycle.   Abscess      Home Medications Prior to Admission medications   Medication Sig Start Date End Date Taking? Authorizing Provider  clindamycin (CLEOCIN) 150 MG capsule Take 2 capsules (300 mg total) by mouth in the morning, at noon, in the evening, and at bedtime for 7 days. May dispense as 150mg  capsules 10/03/23 10/10/23 Yes Emani Morad F, PA-C  doxycycline (VIBRAMYCIN) 100 MG capsule Take 1 capsule (100 mg total) by mouth 2 (two) times daily for 7 days. 10/01/23 10/08/23  Meliton Rattan, PA  Multiple Vitamins-Minerals (MULTIVITAMIN WITH MINERALS) tablet Take 1 tablet by mouth daily.    [provider]      Allergies    Penicillins    Review of Systems   Review of Systems  Skin:  Positive for rash.    Physical Exam Updated Vital Signs BP (!) 145/87   Pulse 96   Temp 98.1 F (36.7 C)   Resp 16   Ht 5\' 6"  (1.676 m)   Wt 88.9 kg   LMP 10/01/2023 (Exact Date)   SpO2 98%   BMI 31.64 kg/m  Physical Exam Vitals and nursing note reviewed.  Constitutional:      General: She is not in acute distress.    Appearance: She is not ill-appearing or toxic-appearing.  HENT:     Head: Normocephalic and atraumatic.   Eyes:     General: No scleral icterus.        Right eye: No discharge.        Left eye: No discharge.     Conjunctiva/sclera: Conjunctivae normal.  Cardiovascular:     Rate and Rhythm: Normal rate.  Pulmonary:     Effort: Pulmonary effort is normal.  Abdominal:     General: Abdomen is flat.  Skin:    General: Skin is warm and dry.     Comments: Acne scabbing on left side of face with mild erythema surrounding approx 1cm. There is also mild swelling of this area from lower portion of left maxilla that extends approx 1cm posterior to left bottom eyelid. Bedside US showing >0.25cm area of fluid that appears to be infected hair follicle - no severe cobblestoning surrounding this area.  Neurological:     General: No focal deficit present.     Mental Status: She is alert. Mental status is at baseline.  Psychiatric:        Mood and Affect: Mood normal.        Behavior: Behavior normal.     ED Results / Procedures / Treatments   Labs (all labs ordered are listed, but only abnormal results are displayed) Labs Reviewed - No data to display  EKG None  Radiology No results found.  Procedures .Ultrasound ED Soft Tissue  Date/Time: 10/03/2023 1:49 PM  Performed by: Dorthy Cooler, PA-C Authorized by: Dorthy Cooler, PA-C   Procedure details:    Indications: localization of abscess and evaluate for cellulitis     Transverse view:  Visualized   Images: archived   Location:    Location: face   Findings:     no abscess present     Medications Ordered in ED Medications  clindamycin (CLEOCIN) capsule 300 mg (has no administration in time range)    ED Course/ Medical Decision Making/ A&P                                 Medical Decision Making Risk Prescription drug management.    This patient presents to the ED for concern of abscess, this involves an extensive number of treatment options, and is a complaint that carries with it a high risk of complications and morbidity.  The differential diagnosis  includes cellulitis, abscess, sepsis, folliculitis, necrotizing fasciitis, impetigo   Co morbidities that complicate the patient evaluation  Anxiety, asthma, depression, migraines, thyroid disease   Additional history obtained:  Dr. Andree Coss PCP   Problem List / ED Course / Critical interventions / Medication management  Patient presents to ED concerned for face cellulitis/abscess. Symptoms started 2-3 days ago. Patient went to UC 2 days ago and has taken 3 doses of doxy so far. Patient concerned because the area of swelling seems to be increasing.  Patient afebrile with stable vitals. <0.25 area of fluid collection on bedside US - looks like inflamed follicle - not drainable. Staffed with Dr. Earlene Plater who recommends switching ABX to Clinda to provide better strep coverage. I appreciate his help. I stressed the importance to the patient to seek emergency care if symptoms continue to progress over the next 24 hours. Patient verbalized understanding.  I have reviewed the patients home medicines and have made adjustments as needed Patient afebrile with stable vitals.  Provided with return precautions.  Discharged in good condition.   Social Determinants of Health:  none        Final Clinical Impression(s) / ED Diagnoses Final diagnoses:  Cellulitis of face    Rx / DC Orders ED Discharge Orders          Ordered    clindamycin (CLEOCIN) 150 MG capsule  4 times daily        10/03/23 1311              Dorthy Cooler, [place] 10/03/23 1551    Laurence Spates, MD 10/03/23 2133

## 2023-10-03 NOTE — ED Notes (Signed)
 Pt verbalized understanding of discharge instructions. Opportunity for questions provided.

## 2023-10-21 ENCOUNTER — Ambulatory Visit (INDEPENDENT_AMBULATORY_CARE_PROVIDER_SITE_OTHER): Admitting: Family

## 2023-10-21 VITALS — BP 118/79 | HR 80 | Temp 98.2°F | Ht 66.0 in | Wt 201.2 lb

## 2023-10-21 DIAGNOSIS — L700 Acne vulgaris: Secondary | ICD-10-CM

## 2023-10-21 MED ORDER — CLINDAMYCIN PHOS-BENZOYL PEROX 1-5 % EX GEL
Freq: Two times a day (BID) | CUTANEOUS | 0 refills | Status: AC
Start: 2023-10-21 — End: ?

## 2023-10-21 MED ORDER — CLINDAMYCIN HCL 300 MG PO CAPS: 300.0000 mg | ORAL_CAPSULE | Freq: Four times a day (QID) | ORAL | 0 refills | Status: AC

## 2023-10-21 NOTE — Progress Notes (Signed)
 Patient ID: Debra Webb, female    DOB: July 19, 1991, 33 y.o.   MRN: [id number]  Chief Complaint  Patient presents with   Skin Problem    Pt c/o recurrent skin infection under right eye, Present for 2 days, has worsened yesterday but not today. .Has tried not wearing make-up and washing face more often, Also has tried previous doxycycline. In the past tried clindamycin which did help sx.        Discussed the use of AI scribe software for clinical note transcription with the patient, who gave verbal consent to proceed.  History of Present Illness   The patient presents with a new skin condition that started in February. Initially, the patient developed acne on her chin and left side of face that went to UC and was given DOXY for, but 2 days later it enlarged and turned into a large abscess on the left side of her face, which was treated then treated with clindamycin. The abscess eventually drained and healed. However, two days ago, the patient noticed a small, hard bump on right side of her face, upper cheek, under her eye. The bump has since grown and caused swelling near the eye. The patient has been using a 10% benzoyl peroxide cream for the last two days, but there has been no improvement. The patient has not been on hormonal birth control since 2018 and is planning to start trying for a baby soon.     Assessment & Plan:   Cystic acne - Recurrent facial abscesses, current one near right eye, resolved previously with clindamycin. Hormonal fluctuations may contribute. Clindamycin preferred due to previous efficacy. - Prescribe clindamycin-benzoyl 5% gel twice daily, cover with facial moisturizer. -Sending refill of oral clindamycin 300mg  qid x7d. - Refer to dermatology for further evaluation, pictures in EMR. -F/U prn      Subjective:    Outpatient Medications Prior to Visit  Medication Sig Dispense Refill   Multiple Vitamins-Minerals (MULTIVITAMIN WITH MINERALS) tablet Take 1 tablet  by mouth daily.     Probiotic Product (PROBIOTIC DAILY PO) Take by mouth.     No facility-administered medications prior to visit.   Past Medical History:  Diagnosis Date   Allergy    Anxiety    Asthma    Depression    Migraines    Thyroid disease    Past Surgical History:  Procedure Laterality Date   TALUS RELEASE     Allergies  Allergen Reactions   Penicillins Anaphylaxis and Hives      Objective:    Physical Exam Vitals and nursing note reviewed.  Constitutional:      Appearance: Normal appearance.  Cardiovascular:     Rate and Rhythm: Normal rate and regular rhythm.  Pulmonary:     Effort: Pulmonary effort is normal.     Breath sounds: Normal breath sounds.  Musculoskeletal:        General: Normal range of motion.  Skin:    General: Skin is warm and dry.     Findings: Acne (right & left upper cheeks, chin, bilateral cheeks) present.  Neurological:     Mental Status: She is alert.  Psychiatric:        Mood and Affect: Mood normal.        Behavior: Behavior normal.      BP 118/79 (BP Location: Left Arm, Patient Position: Sitting, Cuff Size: Large)   Pulse 80   Temp 98.2 F (36.8 C) (Temporal)   Ht 5\' 6"  (1.676  m)   Wt 201 lb 3.2 oz (91.3 kg)   LMP 10/01/2023 (Exact Date)   SpO2 98%   BMI 32.47 kg/m  Wt Readings from Last 3 Encounters:  10/21/23 201 lb 3.2 oz (91.3 kg)  10/03/23 196 lb (88.9 kg)  10/01/23 196 lb 12.8 oz (89.3 kg)      Dulce Sellar, NP

## 2024-02-29 ENCOUNTER — Ambulatory Visit: Payer: Self-pay | Admitting: Family

## 2024-02-29 ENCOUNTER — Encounter: Payer: Self-pay | Admitting: Family

## 2024-02-29 ENCOUNTER — Ambulatory Visit (INDEPENDENT_AMBULATORY_CARE_PROVIDER_SITE_OTHER): Admitting: Family

## 2024-02-29 VITALS — BP 127/85 | HR 60 | Temp 97.9°F | Ht 66.0 in | Wt 208.0 lb

## 2024-02-29 DIAGNOSIS — Z0000 Encounter for general adult medical examination without abnormal findings: Secondary | ICD-10-CM

## 2024-02-29 DIAGNOSIS — R635 Abnormal weight gain: Secondary | ICD-10-CM

## 2024-02-29 LAB — TSH: TSH: 2.36 u[IU]/mL (ref 0.35–5.50)

## 2024-02-29 LAB — CBC WITH DIFFERENTIAL/PLATELET
Basophils Absolute: 0.1 K/uL (ref 0.0–0.1)
Basophils Relative: 1.1 % (ref 0.0–3.0)
Eosinophils Absolute: 0.3 K/uL (ref 0.0–0.7)
Eosinophils Relative: 4.6 % (ref 0.0–5.0)
HCT: 43 % (ref 36.0–46.0)
Hemoglobin: 14.3 g/dL (ref 12.0–15.0)
Lymphocytes Relative: 32.1 % (ref 12.0–46.0)
Lymphs Abs: 2.2 K/uL (ref 0.7–4.0)
MCHC: 33.4 g/dL (ref 30.0–36.0)
MCV: 89.7 fl (ref 78.0–100.0)
Monocytes Absolute: 0.6 K/uL (ref 0.1–1.0)
Monocytes Relative: 8.2 % (ref 3.0–12.0)
Neutro Abs: 3.6 K/uL (ref 1.4–7.7)
Neutrophils Relative %: 54 % (ref 43.0–77.0)
Platelets: 216 K/uL (ref 150.0–400.0)
RBC: 4.8 Mil/uL (ref 3.87–5.11)
RDW: 13 % (ref 11.5–15.5)
WBC: 6.7 K/uL (ref 4.0–10.5)

## 2024-02-29 LAB — LDL CHOLESTEROL, DIRECT: Direct LDL: 67 mg/dL

## 2024-02-29 LAB — COMPREHENSIVE METABOLIC PANEL WITH GFR
ALT: 20 U/L (ref 0–35)
AST: 19 U/L (ref 0–37)
Albumin: 4.5 g/dL (ref 3.5–5.2)
Alkaline Phosphatase: 54 U/L (ref 39–117)
BUN: 12 mg/dL (ref 6–23)
CO2: 25 meq/L (ref 19–32)
Calcium: 9.6 mg/dL (ref 8.4–10.5)
Chloride: 103 meq/L (ref 96–112)
Creatinine, Ser: 0.82 mg/dL (ref 0.40–1.20)
GFR: 94.46 mL/min (ref >60.00)
Glucose, Bld: 84 mg/dL (ref 70–99)
Potassium: 4.3 meq/L (ref 3.5–5.1)
Sodium: 138 meq/L (ref 135–145)
Total Bilirubin: 0.5 mg/dL (ref 0.2–1.2)
Total Protein: 7.4 g/dL (ref 6.0–8.3)

## 2024-02-29 LAB — LIPID PANEL
Cholesterol: 167 mg/dL (ref 0–200)
HDL: 30.9 mg/dL — ABNORMAL LOW (ref >39.00)
NonHDL: 135.82
Total CHOL/HDL Ratio: 5
Triglycerides: 498 mg/dL — ABNORMAL HIGH (ref 0.0–149.0)
VLDL: 99.6 mg/dL — ABNORMAL HIGH (ref 0.0–40.0)

## 2024-02-29 NOTE — Patient Instructions (Signed)
 It was very nice to see you today!   I will review your lab results via MyChart in a few days.  You look great! Stay well! Let me know if you need a referral for the genetic counseling [facility].       PLEASE NOTE:  If you had any lab tests please let us  know if you have not heard back within a few days. You may see your results on MyChart before we have a chance to review them but we will give you a call once they are reviewed by us . If we ordered any referrals today, please let us  know if you have not heard from their office within the next week.

## 2024-02-29 NOTE — Progress Notes (Signed)
 Phone [phone]   Subjective:   Patient is a 33 y.o. female presenting for annual physical.    Chief Complaint  Patient presents with   Annual Exam    Fasting w/ labs  Discussed the use of AI scribe software for clinical note transcription with the patient, who gave verbal consent to proceed.  History of Present Illness Debra Webb is a 33 year old female who presents for routine follow-up and lab work.  Physical activity and exercise tolerance - Increased physical activity, lifting weights three times per week and performing cardiovascular exercise at least once per week - Improved sense of well-being and increased muscle mass without weight gain  Preconception counseling and genetic concerns - Considering starting a family - Concerned about a titin-related genetic disorder affecting her husband's niece, which involves muscle pathology - Anxious about potential genetic risks due to limited knowledge of her husband's paternal family history  Menstrual cycle and fertility tracking - Tracking menstrual cycles and ovulation using basal body temperature and ovulation strips for six months - Menstrual cycles are regular - No use of birth control for an extended period - Plans to begin attempting conception after an upcoming trip to [place]   See problem oriented charting- ROS- full  review of systems was completed and negative  The following were reviewed and entered/updated in epic: Past Medical History:  Diagnosis Date   Allergy    Anxiety    Asthma    Depression    Migraines    Thyroid disease    Patient Active Problem List   Diagnosis Date Noted   Migraine, unspecified, not intractable, without status migrainosus 10/24/2021   Psoriasis 10/24/2021   Hypothyroidism 10/24/2021   Past Surgical History:  Procedure Laterality Date   TALUS RELEASE      No family history on file.  Medications- reviewed and updated Current Outpatient Medications  Medication Sig  Dispense Refill   MAGNESIUM PO Take by mouth.     Multiple Vitamins-Minerals (MULTIVITAMIN WITH MINERALS) tablet Take 1 tablet by mouth daily.     clindamycin -benzoyl peroxide (BENZACLIN) gel Apply topically 2 (two) times daily. (Patient not taking: Reported on 02/29/2024) 25 g 0   Probiotic Product (PROBIOTIC DAILY PO) Take by mouth. (Patient not taking: Reported on 02/29/2024)     No current facility-administered medications for this visit.   Allergies-reviewed and updated Allergies  Allergen Reactions   Penicillins Anaphylaxis and Hives   Social History   Social History Narrative   Not on file   Objective  Objective:  BP 127/85 (BP Location: Left Arm, Patient Position: Sitting, Cuff Size: Large)   Pulse 60   Temp 97.9 F (36.6 C) (Temporal)   Ht 5' 6 (1.676 m)   Wt 208 lb (94.3 kg)   LMP 02/19/2024 (Exact Date)   SpO2 99%   BMI 33.57 kg/m  Physical Exam Vitals and nursing note reviewed.  Constitutional:      Appearance: Normal appearance. She is overweight.  HENT:     Head: Normocephalic.     Right Ear: Tympanic membrane normal.     Left Ear: Tympanic membrane normal.     Nose: Nose normal.     Mouth/Throat:     Mouth: Mucous membranes are moist.  Eyes:     Pupils: Pupils are equal, round, and reactive to light.  Cardiovascular:     Rate and Rhythm: Normal rate and regular rhythm.  Pulmonary:     Effort: Pulmonary effort is normal.  Breath sounds: Normal breath sounds.  Musculoskeletal:        General: Normal range of motion.     Cervical back: Normal range of motion.  Lymphadenopathy:     Cervical: No cervical adenopathy.  Skin:    General: Skin is warm and dry.  Neurological:     Mental Status: She is alert.  Psychiatric:        Mood and Affect: Mood normal.        Behavior: Behavior normal.     Assessment and Plan   Health Maintenance counseling: 1. Anticipatory guidance: Patient counseled regarding regular dental exams q6 months, eye exams,   avoiding smoking and second hand smoke, limiting alcohol to 1 beverage per day, no illicit drugs.   2. Risk factor reduction:  Advised patient of need for regular exercise and diet rich with fruits and vegetables to reduce risk of heart attack and stroke. Wt Readings from Last 3 Encounters:  02/29/24 208 lb (94.3 kg)  10/21/23 201 lb 3.2 oz (91.3 kg)  10/03/23 196 lb (88.9 kg)   3. Immunizations/screenings/ancillary studies Immunization History  Administered Date(s) Administered   DTaP 08/14/1991, 10/19/1991, 12/21/1991, 12/18/1992, 06/22/1996   HIB (PRP-OMP) 08/24/1991, 10/19/1991, 12/21/1991, 09/13/1992   HIB, Unspecified 08/24/1991, 10/19/1991, 12/21/1991, 09/13/1992   HPV Quadrivalent 08/04/2006, 08/08/2007, 10/11/2007   Hepatitis A 11/10/2010   Hepatitis B, PED/ADOLESCENT 12/11/1998, 12/03/1999, 03/24/2000   IPV 08/24/1991, 10/19/1991, 12/21/1991, 06/22/1996   Influenza, Seasonal, Injecte, Preservative Fre 05/11/2015   Influenza-Unspecified 05/28/2021   MMR 09/20/1992, 06/22/1996   Meningococcal polysaccharide vaccine (MPSV4) 08/04/2006   PFIZER(Purple Top)SARS-COV-2 Vaccination 10/30/2019   PPD Test 08/18/2011, 03/01/2013, 02/23/2019   Tdap 08/04/2006, 12/20/2017   Typhoid Live 11/10/2010   There are no preventive care reminders to display for this patient.   4. Cervical cancer screening: 2024 - normal 5. Skin cancer screening- advised regular sunscreen use. Denies worrisome, changing, or new skin lesions.  6. Birth control/STD check: N/A 7. Smoking associated screening: non- smoker 8. Alcohol screening: 2-3/week  Assessment & Plan Preconception Genetic Counseling Concern about a rare titan-related genetic disorder in her niece. Discussed genetic counseling benefits for risk assessment and reproductive decisions. Genetic counselors available at [employer]. - Consider referral to genetic counseling at [facility] for genetic testing and risk assessment. - Inform provider  if a referral is needed for genetic counseling.  Family Planning Planning to conceive at age 29 with regular cycles. Advised trying for 6-12 months before fertility evaluation for women under 35. - Monitor menstrual cycles and ovulation using ovulation kits. - Consider fertility evaluation if not pregnant after 12 months of trying.  Dermatological Concerns No recurrence of facial skin issues after discarding makeup & skin products. Suspected hypersensitivity or contamination. Dermatology appointment scheduled for November. - Maintain dermatology appointment in November for comprehensive skin evaluation.  General Health Maintenance Engaging in regular exercise with weight lifting and cardio. Discussed balanced diet and routine bowel habits. Blood pressure and heart rate normal. Pap smear completed last year. - Continue regular exercise routine with weight lifting and cardio. - Maintain a balanced diet and adequate hydration. - Continue routine bowel habits with high fiber diet and adequate water intake.   Recommended follow up: No follow-ups on file. Future Appointments  Date Time Provider Department Center  06/26/2024 11:00 AM Alm Delon SAILOR, DO [facility] None   Lab/Order associations: fasting  Lucius Krabbe, NP

## 2024-04-12 ENCOUNTER — Other Ambulatory Visit (HOSPITAL_COMMUNITY): Payer: Self-pay

## 2024-04-12 MED ORDER — CIPROFLOXACIN HCL 500 MG PO TABS
500.0000 mg | ORAL_TABLET | Freq: Once | ORAL | 0 refills | Status: AC
  Filled 2024-04-12: qty 1, 1d supply, fill #0

## 2024-04-12 MED ORDER — RIFAMPIN 300 MG PO CAPS
600.0000 mg | ORAL_CAPSULE | Freq: Every day | ORAL | 0 refills | Status: AC
  Filled 2024-04-12: qty 8, 4d supply, fill #0

## 2024-05-19 ENCOUNTER — Other Ambulatory Visit: Payer: Self-pay | Admitting: Medical Genetics

## 2024-05-19 DIAGNOSIS — Z006 Encounter for examination for normal comparison and control in clinical research program: Secondary | ICD-10-CM

## 2024-06-07 DIAGNOSIS — N911 Secondary amenorrhea: Secondary | ICD-10-CM | POA: Diagnosis not present

## 2024-06-15 DIAGNOSIS — Z3481 Encounter for supervision of other normal pregnancy, first trimester: Secondary | ICD-10-CM | POA: Diagnosis not present

## 2024-06-15 DIAGNOSIS — Z3685 Encounter for antenatal screening for Streptococcus B: Secondary | ICD-10-CM | POA: Diagnosis not present

## 2024-06-15 DIAGNOSIS — Z3401 Encounter for supervision of normal first pregnancy, first trimester: Secondary | ICD-10-CM | POA: Diagnosis not present

## 2024-06-15 DIAGNOSIS — Z3A09 9 weeks gestation of pregnancy: Secondary | ICD-10-CM | POA: Diagnosis not present

## 2024-06-23 DIAGNOSIS — Z3A10 10 weeks gestation of pregnancy: Secondary | ICD-10-CM | POA: Diagnosis not present

## 2024-06-23 DIAGNOSIS — Z113 Encounter for screening for infections with a predominantly sexual mode of transmission: Secondary | ICD-10-CM | POA: Diagnosis not present

## 2024-06-23 DIAGNOSIS — Z3401 Encounter for supervision of normal first pregnancy, first trimester: Secondary | ICD-10-CM | POA: Diagnosis not present

## 2024-06-26 ENCOUNTER — Encounter: Payer: Self-pay | Admitting: Dermatology

## 2024-06-26 ENCOUNTER — Ambulatory Visit: Admitting: Dermatology

## 2024-06-26 VITALS — BP 126/79 | HR 81

## 2024-06-26 DIAGNOSIS — L708 Other acne: Secondary | ICD-10-CM | POA: Diagnosis not present

## 2024-06-26 NOTE — Progress Notes (Signed)
   New Patient Visit   Subjective  Debra Webb is a 33 y.o. female who presents for the following: Rash  Patient states she has rash located at the face that she would like to have examined. Patient reports the areas have been there for 9 months. She reports the areas are bothersome. Patient reports she has not previously been treated for these areas.She was prescribed Clindamycin  Swabs & Oral doxycyline and oral clindamycin . Patient denies Hx of bx. Patient denies family history of skin cancer(s).  Patient reports she is actively pregnant. She is currently due in June 2026.  Patient provided verbal consent for the use of an AI-assisted program to generate a detailed after-visit summary. The patient understands that the AI tool is used to support clinical documentation and that all information will be reviewed and verified by the healthcare provider.  The following portions of the chart were reviewed this encounter and updated as appropriate: medications, allergies, medical history  Review of Systems:  No other skin or systemic complaints except as noted in HPI or Assessment and Plan.  Objective  Well appearing patient in no apparent distress; mood and affect are within normal limits.  A focused examination was performed of the following areas: Face  Relevant exam findings are noted in the Assessment and Plan.         Assessment & Plan   Cystic Acne Flare (resolved) and Skin Care Regimen Exam: Open comedones and inflammatory papules  Well controlled  A few months ago pt had a Cystic acne flare  with abscess formation, initially presenting with swelling and tenderness near the eye and neck, resolved with doxycycline  and topical clindamycin . Differential diagnosis included contact dermatitis, but presentation and response to antibiotics suggest cystic acne. Possible triggers include hormonal changes, stress, and environmental factors. No current or chronic acne flares.  - Use  Cetaphil and CeraVe for gentle cleansing. - Apply Avene Tolerance moisturizer in the morning and cicalfate at night. - Use mineral sunscreens such as Neutrogena ultra sheer for sun protection. - Start over-the-counter retinol at night post-breastfeeding. - Return for evaluation if acne flares occur.   Return After finished breast feeding, for Acne F/U.  I, Jetta Ager, am acting as neurosurgeon for [employer], DO.  Documentation: I have reviewed the above documentation for accuracy and completeness, and I agree with the above.  Delon Lenis, DO

## 2024-06-26 NOTE — Patient Instructions (Addendum)
 VISIT SUMMARY:  Today, we discussed your history of severe acne flare-ups, particularly the significant episode you experienced nine months ago. We reviewed your skincare routine and the treatments you have tried, including antibiotics. We also considered potential triggers for your acne, such as hormonal changes, stress, and environmental factors.  YOUR PLAN:  -HISTORY OF ACNE FLARE WITH ABSCESS FORMATION:  The acne flare has fully resolved.    For your skincare, start using Vanicream for gentle cleansing and full body moisturizing.  For your face Apply Avene Tolerance moisturizer in the morning and Ciclophate at night.   Use mineral sunscreens like Neutrogena ultra sheer or Vanicream for sun protection.   You can start using over-the-counter retinol at night after you finish breastfeeding. Please return for evaluation if you experience any new acne flares.  INSTRUCTIONS:  Please follow the skincare routine as discussed. If you experience any new acne flares, return for evaluation.            Important Information   Due to recent changes in healthcare laws, you may see results of your pathology and/or laboratory studies on MyChart before the doctors have had a chance to review them. We understand that in some cases there may be results that are confusing or concerning to you. Please understand that not all results are received at the same time and often the doctors may need to interpret multiple results in order to provide you with the best plan of care or course of treatment. Therefore, we ask that you please give us  2 business days to thoroughly review all your results before contacting the office for clarification. Should we see a critical lab result, you will be contacted sooner.     If You Need Anything After Your Visit   If you have any questions or concerns for your doctor, please call our main line at [phone]. If no one answers, please leave a voicemail as  directed and we will return your call as soon as possible. Messages left after 4 pm will be answered the following business day.    You may also send us  a message via MyChart. We typically respond to MyChart messages within 1-2 business days.  For prescription refills, please ask your pharmacy to contact our office. Our fax number is [phone].  If you have an urgent issue when the clinic is closed that cannot wait until the next business day, you can page your doctor at the number below.     Please note that while we do our best to be available for urgent issues outside of office hours, we are not available 24/7.    If you have an urgent issue and are unable to reach us , you may choose to seek medical care at your doctor's office, retail clinic, urgent care center, or emergency room.   If you have a medical emergency, please immediately call 911 or go to the emergency department. In the event of inclement weather, please call our main line at [phone] for an update on the status of any delays or closures.  Dermatology Medication Tips: Please keep the boxes that topical medications come in in order to help keep track of the instructions about where and how to use these. Pharmacies typically print the medication instructions only on the boxes and not directly on the medication tubes.   If your medication is too expensive, please contact our office at [phone] or send us  a message through MyChart.    We are unable to tell  what your co-pay for medications will be in advance as this is different depending on your insurance coverage. However, we may be able to find a substitute medication at lower cost or fill out paperwork to get insurance to cover a needed medication.    If a prior authorization is required to get your medication covered by your insurance company, please allow us  1-2 business days to complete this process.   Drug prices often vary depending on where the prescription  is filled and some pharmacies may offer cheaper prices.   The website www.goodrx.com contains coupons for medications through different pharmacies. The prices here do not account for what the cost may be with help from insurance (it may be cheaper with your insurance), but the website can give you the price if you did not use any insurance.  - You can print the associated coupon and take it with your prescription to the pharmacy.  - You may also stop by our office during regular business hours and pick up a GoodRx coupon card.  - If you need your prescription sent electronically to a different pharmacy, notify our office through [facility] or by phone at [phone]

## 2024-07-26 ENCOUNTER — Other Ambulatory Visit: Payer: Self-pay

## 2024-07-26 ENCOUNTER — Other Ambulatory Visit (HOSPITAL_COMMUNITY): Payer: Self-pay

## 2024-07-26 MED ORDER — SERTRALINE HCL 25 MG PO TABS
25.0000 mg | ORAL_TABLET | Freq: Every day | ORAL | 3 refills | Status: AC
  Filled 2024-07-26: qty 90, 90d supply, fill #0
